# Patient Record
Sex: Female | Born: 1960 | Race: Black or African American | Hispanic: No | Marital: Married | Smoking: Current every day smoker
Health system: Southern US, Community
[De-identification: ages and names within clinical notes are randomized; demographics above are authoritative.]

## PROBLEM LIST (undated history)

## (undated) DIAGNOSIS — I10 Essential (primary) hypertension: Secondary | ICD-10-CM

## (undated) HISTORY — PX: THYROID SURGERY: SHX805

---

## 2008-06-12 ENCOUNTER — Emergency Department (HOSPITAL_COMMUNITY): Admission: EM | Admit: 2008-06-12 | Discharge: 2008-06-12 | Payer: Self-pay | Admitting: Emergency Medicine

## 2013-09-13 ENCOUNTER — Encounter (HOSPITAL_COMMUNITY): Payer: Self-pay | Admitting: Emergency Medicine

## 2013-09-13 ENCOUNTER — Emergency Department (HOSPITAL_COMMUNITY): Payer: No Typology Code available for payment source

## 2013-09-13 ENCOUNTER — Emergency Department (HOSPITAL_COMMUNITY)
Admission: EM | Admit: 2013-09-13 | Discharge: 2013-09-13 | Disposition: A | Discharge status: Home / Self Care | Payer: No Typology Code available for payment source | Attending: Emergency Medicine | Admitting: Emergency Medicine

## 2013-09-13 DIAGNOSIS — S46909A Unspecified injury of unspecified muscle, fascia and tendon at shoulder and upper arm level, unspecified arm, initial encounter: Secondary | ICD-10-CM | POA: Insufficient documentation

## 2013-09-13 DIAGNOSIS — I10 Essential (primary) hypertension: Secondary | ICD-10-CM | POA: Insufficient documentation

## 2013-09-13 DIAGNOSIS — IMO0002 Reserved for concepts with insufficient information to code with codable children: Secondary | ICD-10-CM | POA: Insufficient documentation

## 2013-09-13 DIAGNOSIS — Y9389 Activity, other specified: Secondary | ICD-10-CM | POA: Insufficient documentation

## 2013-09-13 DIAGNOSIS — M549 Dorsalgia, unspecified: Secondary | ICD-10-CM

## 2013-09-13 DIAGNOSIS — S99929A Unspecified injury of unspecified foot, initial encounter: Secondary | ICD-10-CM

## 2013-09-13 DIAGNOSIS — M79672 Pain in left foot: Secondary | ICD-10-CM

## 2013-09-13 DIAGNOSIS — F17200 Nicotine dependence, unspecified, uncomplicated: Secondary | ICD-10-CM | POA: Insufficient documentation

## 2013-09-13 DIAGNOSIS — S0993XA Unspecified injury of face, initial encounter: Secondary | ICD-10-CM | POA: Insufficient documentation

## 2013-09-13 DIAGNOSIS — Y92410 Unspecified street and highway as the place of occurrence of the external cause: Secondary | ICD-10-CM | POA: Insufficient documentation

## 2013-09-13 DIAGNOSIS — S199XXA Unspecified injury of neck, initial encounter: Principal | ICD-10-CM

## 2013-09-13 DIAGNOSIS — S4980XA Other specified injuries of shoulder and upper arm, unspecified arm, initial encounter: Secondary | ICD-10-CM | POA: Insufficient documentation

## 2013-09-13 DIAGNOSIS — S8990XA Unspecified injury of unspecified lower leg, initial encounter: Secondary | ICD-10-CM | POA: Insufficient documentation

## 2013-09-13 DIAGNOSIS — M542 Cervicalgia: Secondary | ICD-10-CM

## 2013-09-13 DIAGNOSIS — S99919A Unspecified injury of unspecified ankle, initial encounter: Secondary | ICD-10-CM

## 2013-09-13 HISTORY — DX: Essential (primary) hypertension: I10

## 2013-09-13 MED ORDER — CYCLOBENZAPRINE HCL 10 MG PO TABS
5.0000 mg | ORAL_TABLET | Freq: Once | ORAL | Status: AC
  Administered 2013-09-13: 15:00:00 via ORAL
  Filled 2013-09-13: qty 1

## 2013-09-13 MED ORDER — CYCLOBENZAPRINE HCL 5 MG PO TABS: 5.0000 mg | ORAL_TABLET | Freq: Three times a day (TID) | ORAL | Status: AC | PRN

## 2013-09-13 MED ORDER — NAPROXEN 500 MG PO TABS: 500.0000 mg | ORAL_TABLET | Freq: Two times a day (BID) | ORAL | Status: AC

## 2013-09-13 MED ORDER — IBUPROFEN 800 MG PO TABS
800.0000 mg | ORAL_TABLET | Freq: Once | ORAL | Status: AC
  Administered 2013-09-13: 800 mg via ORAL
  Filled 2013-09-13: qty 1
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
## 2013-09-17 NOTE — ED Provider Notes (Signed)
Medical screening examination/treatment/procedure(s) were performed by non-physician practitioner and as supervising physician I was immediately available for consultation/collaboration.   EKG Interpretation None        Silvia Markuson M Woodruff Skirvin, MD 09/17/13 1531 

## 2014-10-13 ENCOUNTER — Emergency Department (HOSPITAL_COMMUNITY): Payer: Self-pay

## 2014-10-13 ENCOUNTER — Emergency Department (HOSPITAL_COMMUNITY)
Admission: EM | Admit: 2014-10-13 | Discharge: 2014-10-13 | Disposition: A | Discharge status: Home / Self Care | Payer: Self-pay | Attending: Emergency Medicine | Admitting: Emergency Medicine

## 2014-10-13 ENCOUNTER — Encounter (HOSPITAL_COMMUNITY): Payer: Self-pay | Admitting: Emergency Medicine

## 2014-10-13 DIAGNOSIS — R109 Unspecified abdominal pain: Secondary | ICD-10-CM | POA: Insufficient documentation

## 2014-10-13 DIAGNOSIS — Z7982 Long term (current) use of aspirin: Secondary | ICD-10-CM | POA: Insufficient documentation

## 2014-10-13 DIAGNOSIS — I10 Essential (primary) hypertension: Secondary | ICD-10-CM | POA: Insufficient documentation

## 2014-10-13 DIAGNOSIS — Z79899 Other long term (current) drug therapy: Secondary | ICD-10-CM | POA: Insufficient documentation

## 2014-10-13 DIAGNOSIS — Z8639 Personal history of other endocrine, nutritional and metabolic disease: Secondary | ICD-10-CM | POA: Insufficient documentation

## 2014-10-13 DIAGNOSIS — Z720 Tobacco use: Secondary | ICD-10-CM | POA: Insufficient documentation

## 2014-10-13 LAB — URINALYSIS, ROUTINE W REFLEX MICROSCOPIC
Glucose, UA: NEGATIVE mg/dL
HGB URINE DIPSTICK: NEGATIVE
KETONES UR: NEGATIVE mg/dL
LEUKOCYTES UA: NEGATIVE
NITRITE: NEGATIVE
PH: 6.5 (ref 5.0–8.0)
Protein, ur: NEGATIVE mg/dL
Specific Gravity, Urine: 1.03 (ref 1.005–1.030)
Urobilinogen, UA: 1 mg/dL (ref 0.0–1.0)

## 2014-10-13 LAB — CBC
HEMATOCRIT: 37.9 % (ref 36.0–46.0)
Hemoglobin: 12.5 g/dL (ref 12.0–15.0)
MCH: 28.4 pg (ref 26.0–34.0)
MCHC: 33 g/dL (ref 30.0–36.0)
MCV: 86.1 fL (ref 78.0–100.0)
PLATELETS: 373 10*3/uL (ref 150–400)
RBC: 4.4 MIL/uL (ref 3.87–5.11)
RDW: 15.2 % (ref 11.5–15.5)
WBC: 9.6 10*3/uL (ref 4.0–10.5)

## 2014-10-13 LAB — COMPREHENSIVE METABOLIC PANEL
ALK PHOS: 77 U/L (ref 38–126)
ALT: 14 U/L (ref 14–54)
AST: 21 U/L (ref 15–41)
Albumin: 4 g/dL (ref 3.5–5.0)
Anion gap: 8 (ref 5–15)
BILIRUBIN TOTAL: 0.3 mg/dL (ref 0.3–1.2)
BUN: 13 mg/dL (ref 6–20)
CALCIUM: 9.2 mg/dL (ref 8.9–10.3)
CHLORIDE: 105 mmol/L (ref 101–111)
CO2: 27 mmol/L (ref 22–32)
CREATININE: 0.61 mg/dL (ref 0.44–1.00)
GFR calc Af Amer: >60 mL/min (ref >60)
GLUCOSE: 119 mg/dL — AB (ref 65–99)
Potassium: 3.8 mmol/L (ref 3.5–5.1)
SODIUM: 140 mmol/L (ref 135–145)
Total Protein: 8.5 g/dL — ABNORMAL HIGH (ref 6.5–8.1)

## 2014-10-13 LAB — LIPASE, BLOOD: LIPASE: 38 U/L (ref 22–51)

## 2014-10-13 MED ORDER — SODIUM CHLORIDE 0.9 % IV BOLUS (SEPSIS)
1000.0000 mL | Freq: Once | INTRAVENOUS | Status: AC
  Administered 2014-10-13: 1000 mL via INTRAVENOUS

## 2014-10-13 MED ORDER — KETOROLAC TROMETHAMINE 30 MG/ML IJ SOLN
30.0000 mg | Freq: Once | INTRAMUSCULAR | Status: AC
  Administered 2014-10-13: 30 mg via INTRAVENOUS
  Filled 2014-10-13: qty 1

## 2014-10-13 MED ORDER — CYCLOBENZAPRINE HCL 10 MG PO TABS: 10.0000 mg | ORAL_TABLET | Freq: Two times a day (BID) | ORAL | Status: AC | PRN

## 2014-10-13 MED ORDER — ONDANSETRON HCL 4 MG PO TABS: 4.0000 mg | ORAL_TABLET | Freq: Four times a day (QID) | ORAL | Status: AC | PRN

## 2014-10-13 MED ORDER — ONDANSETRON HCL 4 MG/2ML IJ SOLN
4.0000 mg | Freq: Once | INTRAMUSCULAR | Status: AC
  Administered 2014-10-13: 4 mg via INTRAVENOUS
  Filled 2014-10-13: qty 2

## 2014-10-13 MED ORDER — CYCLOBENZAPRINE HCL 10 MG PO TABS
5.0000 mg | ORAL_TABLET | Freq: Once | ORAL | Status: AC
  Administered 2014-10-13: 5 mg via ORAL
  Filled 2014-10-13: qty 1

## 2014-10-13 NOTE — ED Provider Notes (Signed)
CSN: [id number]     Arrival date & time 10/13/14  1436 History   First MD Initiated Contact with Patient 10/13/14 1521     Chief Complaint  Patient presents with  . Flank Pain    left     (Consider location/radiation/quality/duration/timing/severity/associated sxs/prior Treatment) HPI 54 year old female with history of HTN and hypothyroidism who presents with flank pain. This morning was in usual state of health, and developed sudden onset of sharp left flank pain. Took motrin and then developed N/V subsequently after. Reports similar pain a long time ago, but went away on it's on and was told it was muscle spasm. Denies dysuria, urinary frequency, hematuria. Denies significant abdominal pain. No diarrhea, melena, hematochezia. History of tubal ligation and no other abdominal surgeries. Denies trauma, exertional activity, or heavy lifting prior to onset of symptoms. No weakness, numbness, urinary or bowel incontinence.No chest pain, difficulty breathing, or abdominal pain.   Past Medical History  Diagnosis Date  . Hypertension    Past Surgical History  Procedure Laterality Date  . Thyroid surgery     History reviewed. No pertinent family history. History  Substance Use Topics  . Smoking status: Current Every Day Smoker  . Smokeless tobacco: Not on file  . Alcohol Use: No   OB History    No data available     Review of Systems  10/14 systems reviewed and are negative other than those stated in the HPI   Allergies  Acetaminophen 8 hour and Zinc  Home Medications   Prior to Admission medications   Medication Sig Start Date End Date Taking? Authorizing Provider  aspirin 81 MG chewable tablet Chew 81 mg by mouth daily.   Yes Historical Provider, MD  ibuprofen (ADVIL,MOTRIN) 800 MG tablet Take 800 mg by mouth every 8 (eight) hours as needed for moderate pain.   Yes Historical Provider, MD  lisinopril-hydrochlorothiazide (PRINZIDE,ZESTORETIC) 20-12.5 MG per tablet Take 1  tablet by mouth daily.   Yes Historical Provider, MD  loratadine (CLARITIN) 10 MG tablet Take 10 mg by mouth daily.   Yes Historical Provider, MD  cyclobenzaprine (FLEXERIL) 5 MG tablet Take 1 tablet (5 mg total) by mouth 3 (three) times daily as needed for muscle spasms. Patient not taking: Reported on 10/13/2014 09/13/13   Jillyn Ledger, PA-C  naproxen (NAPROSYN) 500 MG tablet Take 1 tablet (500 mg total) by mouth 2 (two) times daily with a meal. Patient not taking: Reported on 10/13/2014 09/13/13   Jillyn Ledger, PA-C   BP 141/65 mmHg  Pulse 99  Temp(Src) 98.4 F (36.9 C) (Oral)  Resp 16  Ht 5' (1.524 m)  Wt 180 lb (81.647 kg)  BMI 35.15 kg/m2  SpO2 100% Physical Exam  Nursing note and vitals reviewed. Physical Exam  Constitutional: Well developed, well nourished, non-toxic, and in no acute distress Head: Normocephalic and atraumatic.  Mouth/Throat: Oropharynx is clear and moist.  Neck: Normal range of motion. Neck supple.  Cardiovascular: Normal rate and regular rhythm.  no edema Pulmonary/Chest: Effort normal and breath sounds normal.  Abdominal: Soft. There is no tenderness. There is no rebound and no guarding. Mild left CVA tenderness. Musculoskeletal: Normal range of motion.  Neurological: Alert, no facial droop, fluent speech, moves all extremities symmetrically Skin: Skin is warm and dry.  Psychiatric: Cooperative  ED Course  Procedures (including critical care time) Labs Review Labs Reviewed  URINALYSIS, ROUTINE W REFLEX MICROSCOPIC (NOT AT [facility]) - Abnormal; Notable for the following:  Bilirubin Urine SMALL (*)    All other components within normal limits  COMPREHENSIVE METABOLIC PANEL - Abnormal; Notable for the following:    Glucose, Bld 119 (*)    Total Protein 8.5 (*)    All other components within normal limits  LIPASE, BLOOD  CBC    Imaging Review Ct Renal Stone Study  10/13/2014   CLINICAL DATA:  54 year old female with history of HTN and  hypothyroidism who presents with flank pain. This morning was in usual state of health, and developed sudden onset of sharp left flank pain. Took motrin and then developed N/V subsequently after. Reports similar pain a long time ago, but went away on it's on and was told it was muscle spasm. Denies dysuria, urinary frequency, hematuria.  EXAM: CT ABDOMEN AND PELVIS WITHOUT CONTRAST  TECHNIQUE: Multidetector CT imaging of the abdomen and pelvis was performed following the standard protocol without IV contrast.  COMPARISON:  None.  FINDINGS: Lung bases:  Clear.  Heart normal size.  Liver, spleen, gallbladder, pancreas, adrenal glands:  Unremarkable.  Kidneys, ureters, bladder: 2.5 cm upper pole low-density right renal mass consistent with a cyst. No other renal masses. No renal stones. No hydronephrosis. Ureters normal in course and in caliber. Bladder is unremarkable.  Uterus and adnexa:  Unremarkable.  Lymph nodes:  No enlarged lymph nodes.  Ascites:  None.  Gastrointestinal:  Unremarkable.  Normal appendix visualized.  Musculoskeletal: Mild facet degenerative change in the lower lumbar spine. Sclerosis noted adjacent to both SI joints. No other abnormality.  IMPRESSION: 1. No acute findings. 2. No renal or ureteral stones or obstructive uropathy. 3. 2.5 cm right renal cyst.   Electronically Signed   By: Amie Portland M.D.   On: 10/13/2014 17:36     EKG Interpretation None      MDM   Final diagnoses:  Left flank pain    In short, this is a 54 year old female with history of hypertension and hypothyroidism who presents to emergency department with sudden onset of left flank pain. Patient had initially presented from urgent care and was given a dose of ciprofloxacin prior to arrival. She is nontoxic on presentation, but does appear to be very uncomfortable secondary to pain. Vital signs are overall nontender and in the ED and she is afebrile. She on exam has a soft and non-peritoneal abdomen, with no  significant tenderness. She does have some mild left CVA tenderness. UA does not show evidence of blood or urinary tract infection. However given sudden onset of pain that does not seem to be musculoskeletal related a CT renal stone study was performed. This visualized and reviewed with radiology. This shows no acute intra-abdominal processes. No evidence of hydronephrosis, renal stone, or inflammatory changes. She has no risk factors for aortic disease, and I do not think that this is the etiology of her symptoms. CBC, CMP, lipase unremarkable. She is given IV fluids, Toradol, and Zofran for symptomatically controlled to good effect. She is given supportive care instructions for home for possible musculoskeletal back pain as we have ruled out intra-abdominal and retroperitoneal processes on CT. Strict return and follow-up instructions are reviewed. She expected her seen able discharge instructions with the plan of care.    Lavera Guise, MD 10/13/14 [phone]

## 2014-10-13 NOTE — Discharge Instructions (Signed)
Take Motrin and Tylenol for pain control at home as needed. Ice heart your back while at rest. Avoid prolonged bed rest, and attempt to move around as much as she can. Return without fail for worsening symptoms, including worsening pain, inability to walk, new weakness or numbness, fevers, vomiting or unable to keep down food or fluids, or any other symptoms concerning to you.  Flank Pain Flank pain refers to pain that is located on the side of the body between the upper abdomen and the back. The pain may occur over a short period of time (acute) or may be long-term or reoccurring (chronic). It may be mild or severe. Flank pain can be caused by many things. CAUSES  Some of the more common causes of flank pain include:  Muscle strains.   Muscle spasms.   A disease of your spine (vertebral disk disease).   A lung infection (pneumonia).   Fluid around your lungs (pulmonary edema).   A kidney infection.   Kidney stones.   A very painful skin rash caused by the chickenpox virus (shingles).   Gallbladder disease.  HOME CARE INSTRUCTIONS  Home care will depend on the cause of your pain. In general,  Rest as directed by your caregiver.  Drink enough fluids to keep your urine clear or pale yellow.  Only take over-the-counter or prescription medicines as directed by your caregiver. Some medicines may help relieve the pain.  Tell your caregiver about any changes in your pain.  Follow up with your caregiver as directed. SEEK IMMEDIATE MEDICAL CARE IF:   Your pain is not controlled with medicine.   You have new or worsening symptoms.  Your pain increases.   You have abdominal pain.   You have shortness of breath.   You have persistent nausea or vomiting.   You have swelling in your abdomen.   You feel faint or pass out.   You have blood in your urine.  You have a fever or persistent symptoms for more than 2-3 days.  You have a fever and your symptoms  suddenly get worse. MAKE SURE YOU:   Understand these instructions.  Will watch your condition.  Will get help right away if you are not doing well or get worse. Document Released: 04/15/2005 Document Revised: 11/17/2011 Document Reviewed: 10/07/2011 [facility] Patient Information 2015 [place], [place]. This information is not intended to replace advice given to you by your health care provider. Make sure you discuss any questions you have with your health care provider.

## 2014-10-13 NOTE — ED Notes (Signed)
Per pt was seen earlier today at urgent care for similar symptoms and emesis. Pt was referred to further evaluation at ED. Pt was given Zofran and Cipro IM. Pt still reports left flank pain and abdominal pian. Per pt an UA was done yet no signs of infection. Pt reports also nausea.

## 2014-10-13 NOTE — ED Notes (Signed)
Unable to collect labs at this time.  I made nurse aware.

## 2016-06-13 IMAGING — CT CT RENAL STONE PROTOCOL
2 of 3 series · 17 of 35 positions shown, 19 images · non-contrast
Comparison: None.

CLINICAL DATA: 52 year old female with history of HTN and
hypothyroidism who presents with flank pain. This morning was in
usual state of health, and developed sudden onset of sharp left
flank pain. Took motrin and then developed N/V subsequently after.
Reports similar pain a long time ago, but went away on it's on and
was told it was muscle spasm. Denies dysuria, urinary frequency,
hematuria.

EXAM:
CT ABDOMEN AND PELVIS WITHOUT CONTRAST
TECHNIQUE: Multidetector CT imaging of the abdomen and pelvis was performed
following the standard protocol without IV contrast.

[Series 3: coronal · coronal · 0.91mm/px · 3 of 126 slices shown]
[im 42/126  soft-tissue]
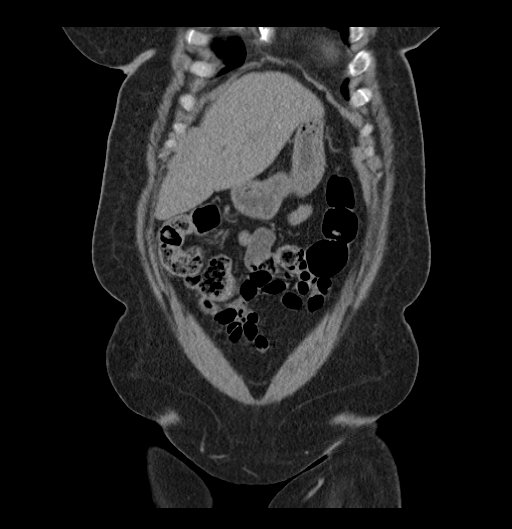
[im 56/126  soft-tissue]
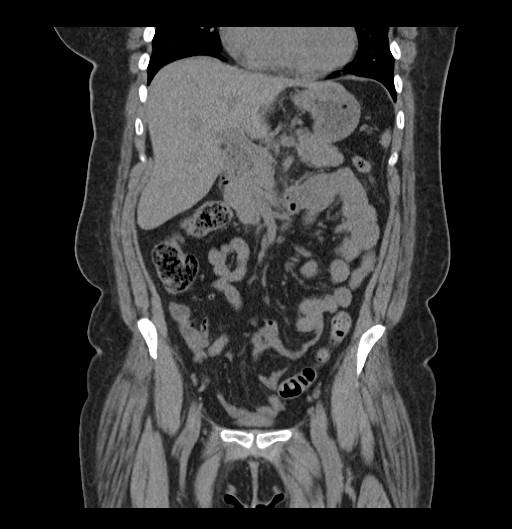
[im 70/126  soft-tissue]
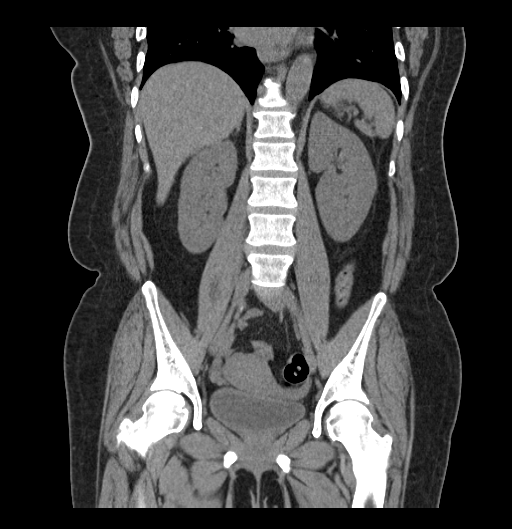

[Series 6: lung · axial · 0.74mm/px · z∈[+1434,+1524]mm · 14 of 21 slices shown, 16 images]
[im 2/21  soft-tissue]
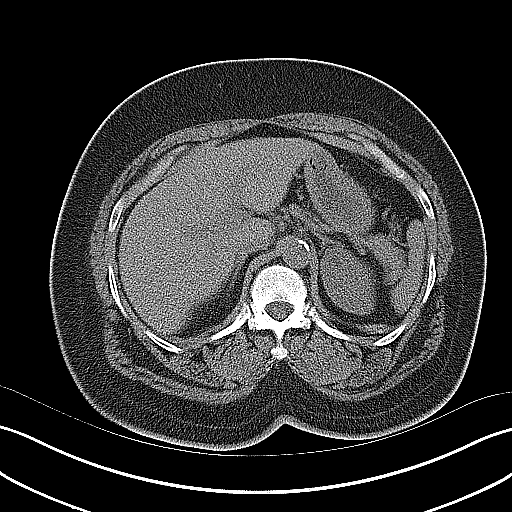
[im 2/21  bone]
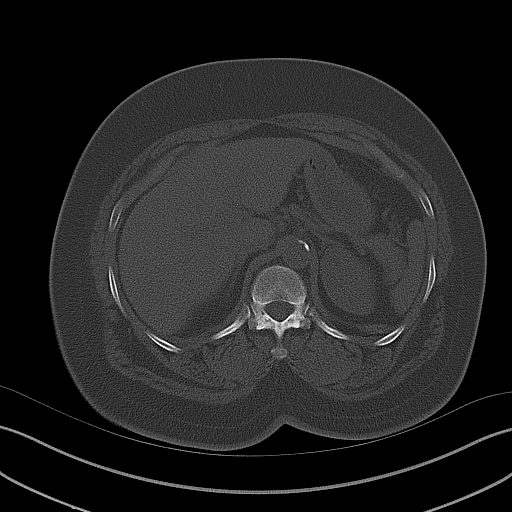
[im 4/21  soft-tissue]
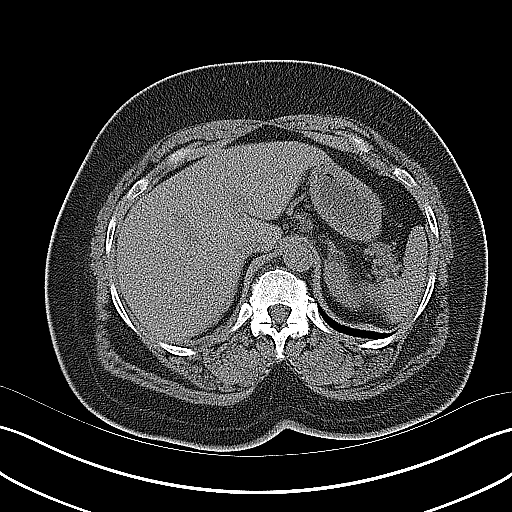
[im 5/21  soft-tissue]
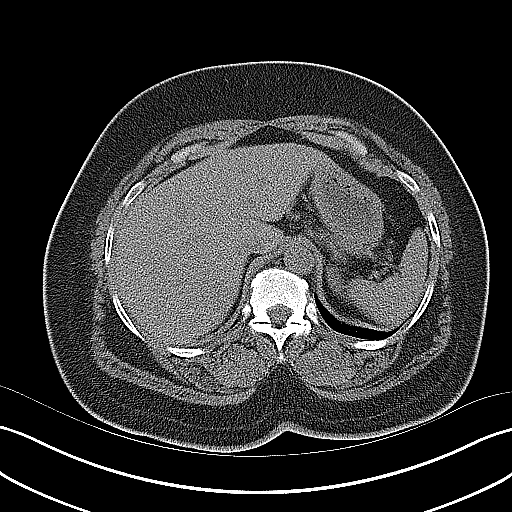
[im 6/21  soft-tissue]
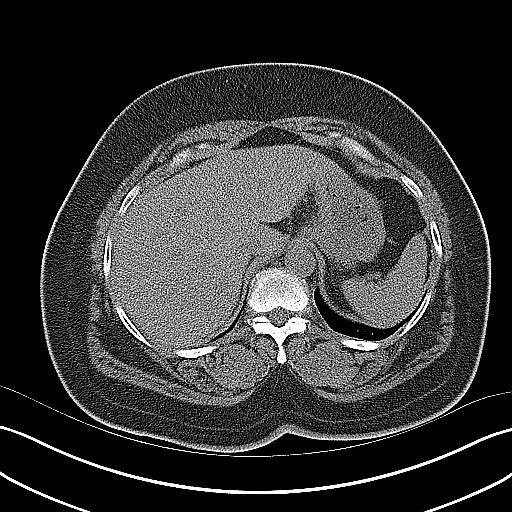
[im 8/21  soft-tissue]
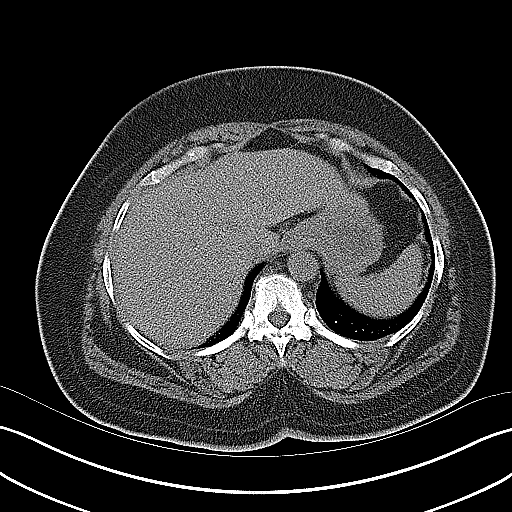
[im 9/21  soft-tissue]
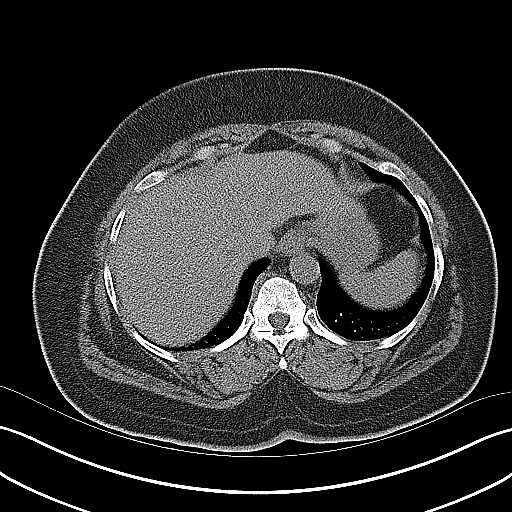
[im 10/21  soft-tissue]
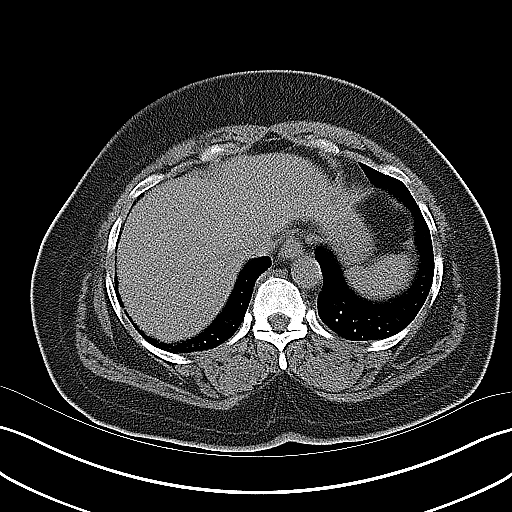
[im 12/21  soft-tissue]
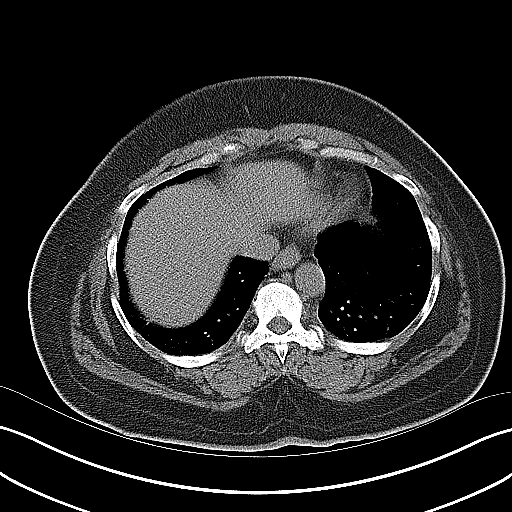
[im 13/21  soft-tissue]
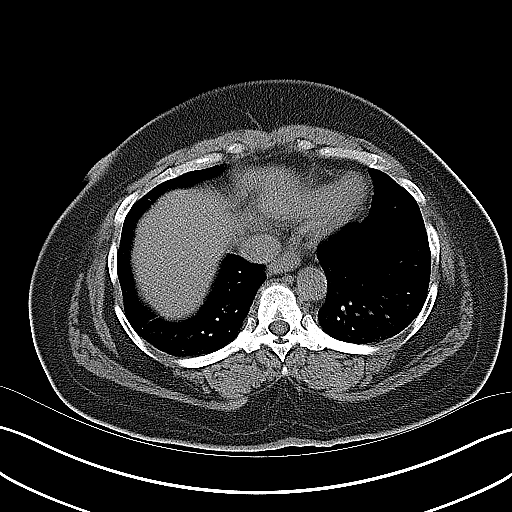
[im 13/21  bone]
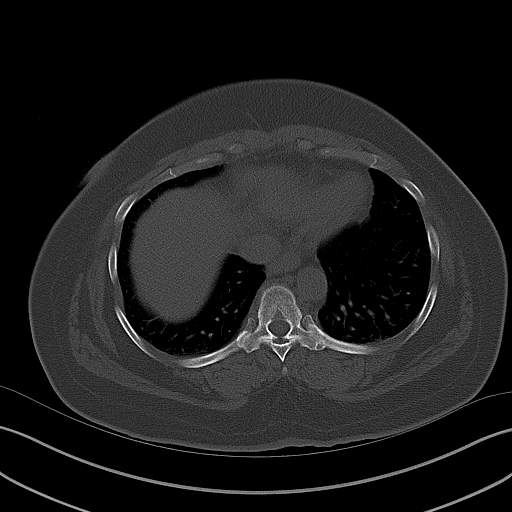
[im 14/21  soft-tissue]
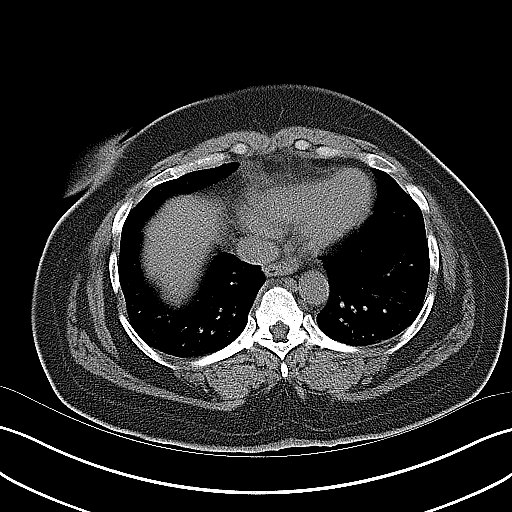
[im 16/21  soft-tissue]
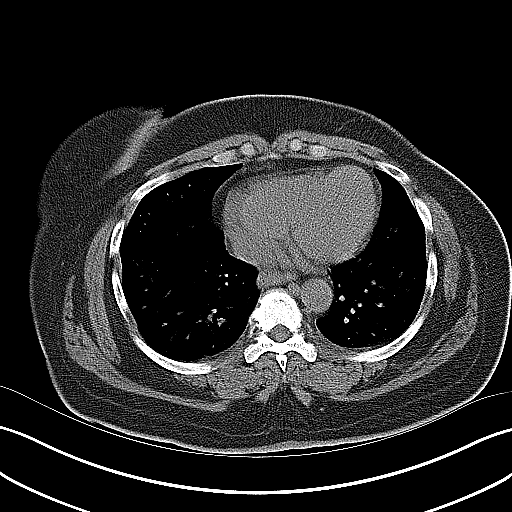
[im 17/21  soft-tissue]
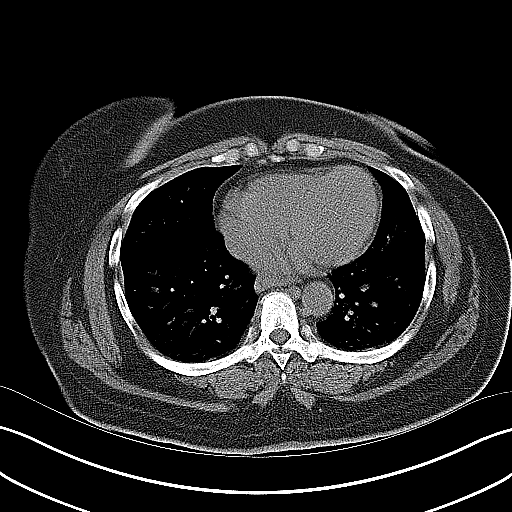
[im 18/21  soft-tissue]
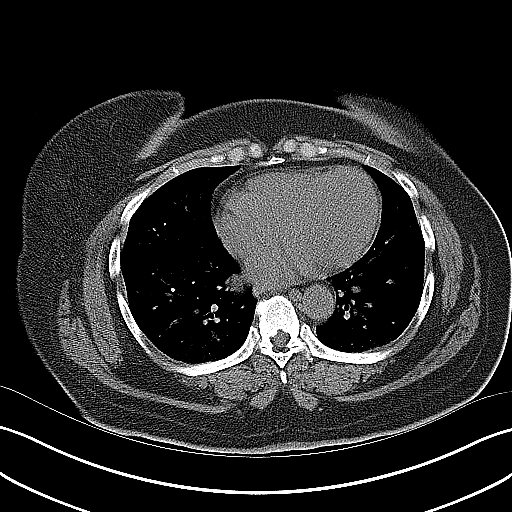
[im 20/21  soft-tissue]
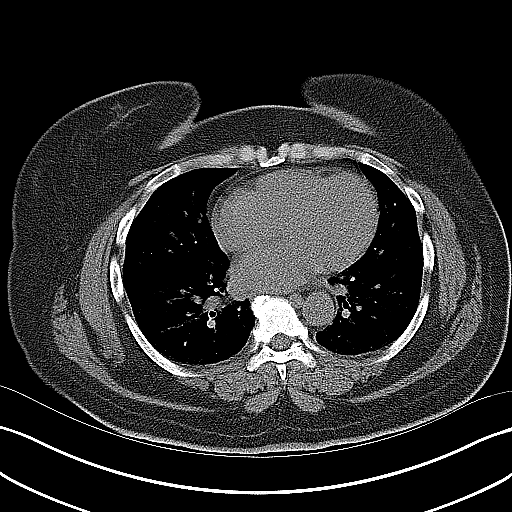

[17 of 35 positions shown; findings below may reference images not displayed]

FINDINGS: Lung bases:  Clear.  Heart normal size.

Liver, spleen, gallbladder, pancreas, adrenal glands:  Unremarkable.

Kidneys, ureters, bladder: 2.5 cm upper pole low-density right renal
mass consistent with a cyst. No other renal masses. No renal stones.
No hydronephrosis. Ureters normal in course and in caliber. Bladder
is unremarkable.

Uterus and adnexa:  Unremarkable.

Lymph nodes:  No enlarged lymph nodes.

Ascites:  None.

Gastrointestinal:  Unremarkable.  Normal appendix visualized.

Musculoskeletal: Mild facet degenerative change in the lower lumbar
spine. Sclerosis noted adjacent to both SI joints. No other
abnormality.
IMPRESSION: 1. No acute findings.
2. No renal or ureteral stones or obstructive uropathy.
3. 2.5 cm right renal cyst.
